# Patient Record
Sex: Female | Born: 1985
Health system: Southern US, Community
[De-identification: ages and names within clinical notes are randomized; demographics above are authoritative.]

---

## 2012-05-01 ENCOUNTER — Emergency Department (HOSPITAL_BASED_OUTPATIENT_CLINIC_OR_DEPARTMENT_OTHER)
Admission: EM | Admit: 2012-05-01 | Discharge: 2012-05-01 | Disposition: A | Discharge status: Home / Self Care | Payer: Self-pay | Attending: Emergency Medicine | Admitting: Emergency Medicine

## 2012-05-01 ENCOUNTER — Encounter (HOSPITAL_BASED_OUTPATIENT_CLINIC_OR_DEPARTMENT_OTHER): Payer: Self-pay | Admitting: *Deleted

## 2012-05-01 DIAGNOSIS — K029 Dental caries, unspecified: Secondary | ICD-10-CM | POA: Insufficient documentation

## 2012-05-01 MED ORDER — HYDROCODONE-ACETAMINOPHEN 5-500 MG PO TABS: 1.0000 | ORAL_TABLET | Freq: Four times a day (QID) | ORAL | Status: AC | PRN

## 2012-05-01 MED ORDER — PENICILLIN V POTASSIUM 500 MG PO TABS: 500.0000 mg | ORAL_TABLET | Freq: Four times a day (QID) | ORAL | Status: AC

## 2012-05-01 MED ORDER — PENICILLIN V POTASSIUM 250 MG PO TABS
500.0000 mg | ORAL_TABLET | Freq: Once | ORAL | Status: AC
  Administered 2012-05-01: 500 mg via ORAL
  Filled 2012-05-01: qty 2

## 2012-05-01 MED ORDER — HYDROCODONE-ACETAMINOPHEN 5-325 MG PO TABS
2.0000 | ORAL_TABLET | Freq: Once | ORAL | Status: AC
  Administered 2012-05-01: 2 via ORAL
  Filled 2012-05-01: qty 2

## 2012-05-01 NOTE — ED Provider Notes (Signed)
History     CSN: [id number]  Arrival date & time 05/01/12  2137   First MD Initiated Contact with Patient 05/01/12 2319      Chief Complaint  Patient presents with  . Dental Pain    (Consider location/radiation/quality/duration/timing/severity/associated sxs/prior treatment) HPI Comments: Patient presents with a two-day history of pain to her left upper and lower back teeth she denies any facial swelling, fevers or difficulty swallowing. She's had some nausea no vomiting. She has not yet seen a dentist for this problem. The pain is beginning worse over the last 2 days she's been taking over-the-counter medicines without relief  Patient is a 26 y.o. female presenting with tooth pain. The history is provided by the patient.  Dental PainThe primary symptoms include mouth pain. Primary symptoms do not include headaches, fever or sore throat.  Additional symptoms do not include: trouble swallowing and fatigue.    History reviewed. No pertinent past medical history.  History reviewed. No pertinent past surgical history.  No family history on file.  History  Substance Use Topics  . Smoking status: Never Smoker   . Smokeless tobacco: Not on file  . Alcohol Use: Yes     rarely    OB History    Grav Para Term Preterm Abortions TAB SAB Ect Mult Living                  Review of Systems  Constitutional: Negative for fever and fatigue.  HENT: Positive for dental problem. Negative for congestion, sore throat, rhinorrhea and trouble swallowing.   Gastrointestinal: Positive for nausea. Negative for vomiting.  Skin: Negative for wound.  Neurological: Negative for dizziness and headaches.    Allergies  Review of patient's allergies indicates no known allergies.  Home Medications   Current Outpatient Rx  Name Route Sig Dispense Refill  . IBUPROFEN 200 MG PO TABS Oral Take 400 mg by mouth every 6 (six) hours as needed. Patient used this medication for her toothache    .  HYDROCODONE-ACETAMINOPHEN 5-500 MG PO TABS Oral Take 1-2 tablets by mouth every 6 (six) hours as needed for pain. 15 tablet 0  . PENICILLIN V POTASSIUM 500 MG PO TABS Oral Take 1 tablet (500 mg total) by mouth 4 (four) times daily. 40 tablet 0    BP 131/85  Pulse 87  Temp 98.2 F (36.8 C) (Oral)  Resp 18  Ht 5\' 2"  (1.575 m)  Wt 175 lb (79.379 kg)  BMI 32.01 kg/m2  SpO2 99%  LMP 04/18/2012  Physical Exam  Constitutional: She is oriented to person, place, and time. She appears well-developed and well-nourished.  HENT:  Head: Normocephalic and atraumatic.       Patient is doing caries throughout with tenderness over her the left upper first molar and the left lower second molar. There is no induration no fluctuance, no trismus, uvula is midline. No elevation of the tongue  Cardiovascular: Normal heart sounds.   Pulmonary/Chest: Effort normal.  Abdominal: Soft. There is no tenderness.  Lymphadenopathy:    She has no cervical adenopathy.  Neurological: She is alert and oriented to person, place, and time.    ED Course  Procedures (including critical care time)  Labs Reviewed - No data to display No results found.   1. Dental caries       MDM  We'll treat with antibiotics, pain medicine and encourage patient to follow up with a dentist as soon as possible  Rolan Bucco, MD 05/01/12 [phone]

## 2012-05-01 NOTE — ED Notes (Signed)
Left sided upper/lower toothache since yesterday, worsened tonight. Poor dentition. Denies fevers.

## 2012-12-07 ENCOUNTER — Encounter (HOSPITAL_BASED_OUTPATIENT_CLINIC_OR_DEPARTMENT_OTHER): Payer: Self-pay

## 2012-12-07 ENCOUNTER — Emergency Department (HOSPITAL_BASED_OUTPATIENT_CLINIC_OR_DEPARTMENT_OTHER)
Admission: EM | Admit: 2012-12-07 | Discharge: 2012-12-07 | Disposition: A | Discharge status: Home / Self Care | Payer: Medicaid Other | Attending: Emergency Medicine | Admitting: Emergency Medicine

## 2012-12-07 DIAGNOSIS — Z79899 Other long term (current) drug therapy: Secondary | ICD-10-CM | POA: Insufficient documentation

## 2012-12-07 DIAGNOSIS — K029 Dental caries, unspecified: Secondary | ICD-10-CM | POA: Insufficient documentation

## 2012-12-07 DIAGNOSIS — K0889 Other specified disorders of teeth and supporting structures: Secondary | ICD-10-CM

## 2012-12-07 MED ORDER — AMOXICILLIN 500 MG PO CAPS: 500.0000 mg | ORAL_CAPSULE | Freq: Three times a day (TID) | ORAL | Status: DC

## 2012-12-07 MED ORDER — IBUPROFEN 800 MG PO TABS
800.0000 mg | ORAL_TABLET | Freq: Once | ORAL | Status: AC
  Administered 2012-12-07: 800 mg via ORAL
  Filled 2012-12-07: qty 1

## 2012-12-07 NOTE — ED Provider Notes (Signed)
History     CSN: [id number]  Arrival date & time 12/07/12  1026   First MD Initiated Contact with Patient 12/07/12 1044      Chief Complaint  Patient presents with  . Dental Pain     Patient is a 27 y.o. female presenting with tooth pain. The history is provided by the patient.  Dental PainThe primary symptoms include mouth pain. Primary symptoms do not include fever. The symptoms began 3 to 5 days ago. The symptoms are worsening. The symptoms are new. The symptoms occur constantly.  Additional symptoms do not include: facial swelling.    History reviewed. No pertinent past medical history.  History reviewed. No pertinent past surgical history.  History reviewed. No pertinent family history.  History  Substance Use Topics  . Smoking status: Passive Smoke Exposure - Never Smoker  . Smokeless tobacco: Never Used  . Alcohol Use: Yes     Comment: rarely    OB History   Grav Para Term Preterm Abortions TAB SAB Ect Mult Living                  Review of Systems  Constitutional: Negative for fever.  HENT: Negative for facial swelling.     Allergies  Review of patient's allergies indicates no known allergies.  Home Medications   Current Outpatient Rx  Name  Route  Sig  Dispense  Refill  . ibuprofen (ADVIL,MOTRIN) 200 MG tablet   Oral   Take 400 mg by mouth every 6 (six) hours as needed. Patient used this medication for her toothache         . amoxicillin (AMOXIL) 500 MG capsule   Oral   Take 1 capsule (500 mg total) by mouth 3 (three) times daily.   21 capsule   0     BP 132/76  Pulse 88  Temp(Src) 98.5 F (36.9 C) (Oral)  Resp 16  Ht 5\' 2"  (1.575 m)  Wt 175 lb (79.379 kg)  BMI 32 kg/m2  SpO2 100%  LMP 12/05/2012  Physical Exam CONSTITUTIONAL: Well developed/well nourished HEAD AND FACE: Normocephalic/atraumatic EYES: EOMI/PERRL ENMT: Mucous membranes moist.  Poor dentition.  No trismus.  No focal abscess noted. NECK: supple no meningeal  signs CV: S1/S2 noted, no murmurs/rubs/gallops noted LUNGS: Lungs are clear to auscultation bilaterally, no apparent distress ABDOMEN: soft, nontender, no rebound or guarding NEURO: Pt is awake/alert, moves all extremitiesx4 EXTREMITIES:full ROM SKIN: warm, color normal  ED Course  Procedures  1. Pain, dental       MDM  Nursing notes including past medical history and social history reviewed and considered in documentation         Joya Gaskins, MD 12/07/12 1109

## 2012-12-07 NOTE — ED Notes (Signed)
Pt states that she has toothache in the R upper posterior jaw.  Pt states that she does not have health insurance until march and is trying to hold out on going to the dentist until then because she cannot afford it.  Pt states that she has been taking motrin for the pain with no relief.  Pt denies fever, n/v.

## 2017-11-22 ENCOUNTER — Other Ambulatory Visit: Payer: Self-pay

## 2017-11-22 ENCOUNTER — Emergency Department (HOSPITAL_BASED_OUTPATIENT_CLINIC_OR_DEPARTMENT_OTHER): Payer: PRIVATE HEALTH INSURANCE

## 2017-11-22 ENCOUNTER — Encounter (HOSPITAL_BASED_OUTPATIENT_CLINIC_OR_DEPARTMENT_OTHER): Payer: Self-pay | Admitting: *Deleted

## 2017-11-22 ENCOUNTER — Emergency Department (HOSPITAL_BASED_OUTPATIENT_CLINIC_OR_DEPARTMENT_OTHER)
Admission: EM | Admit: 2017-11-22 | Discharge: 2017-11-22 | Disposition: A | Discharge status: Home / Self Care | Payer: PRIVATE HEALTH INSURANCE | Attending: Emergency Medicine | Admitting: Emergency Medicine

## 2017-11-22 DIAGNOSIS — Z7722 Contact with and (suspected) exposure to environmental tobacco smoke (acute) (chronic): Secondary | ICD-10-CM | POA: Diagnosis not present

## 2017-11-22 DIAGNOSIS — J189 Pneumonia, unspecified organism: Secondary | ICD-10-CM

## 2017-11-22 DIAGNOSIS — R509 Fever, unspecified: Secondary | ICD-10-CM | POA: Diagnosis present

## 2017-11-22 DIAGNOSIS — J181 Lobar pneumonia, unspecified organism: Secondary | ICD-10-CM | POA: Insufficient documentation

## 2017-11-22 LAB — BASIC METABOLIC PANEL
Anion gap: 10 (ref 5–15)
BUN: <5 mg/dL — ABNORMAL LOW (ref 6–20)
CO2: 21 mmol/L — ABNORMAL LOW (ref 22–32)
Calcium: 9.2 mg/dL (ref 8.9–10.3)
Chloride: 104 mmol/L (ref 101–111)
Creatinine, Ser: 0.57 mg/dL (ref 0.44–1.00)
GFR calc Af Amer: >60 mL/min (ref >60)
GFR calc non Af Amer: >60 mL/min (ref >60)
Glucose, Bld: 118 mg/dL — ABNORMAL HIGH (ref 65–99)
Potassium: 3.4 mmol/L — ABNORMAL LOW (ref 3.5–5.1)
Sodium: 135 mmol/L (ref 135–145)

## 2017-11-22 LAB — CBC WITH DIFFERENTIAL/PLATELET
Basophils Absolute: 0 10*3/uL (ref 0.0–0.1)
Basophils Relative: 0 %
Eosinophils Absolute: 0.1 10*3/uL (ref 0.0–0.7)
Eosinophils Relative: 1 %
HCT: 39.3 % (ref 36.0–46.0)
Hemoglobin: 13 g/dL (ref 12.0–15.0)
Lymphocytes Relative: 17 %
Lymphs Abs: 1.7 10*3/uL (ref 0.7–4.0)
MCH: 29 pg (ref 26.0–34.0)
MCHC: 33.1 g/dL (ref 30.0–36.0)
MCV: 87.7 fL (ref 78.0–100.0)
Monocytes Absolute: 0.8 10*3/uL (ref 0.1–1.0)
Monocytes Relative: 8 %
Neutro Abs: 7.7 10*3/uL (ref 1.7–7.7)
Neutrophils Relative %: 74 %
Platelets: 210 10*3/uL (ref 150–400)
RBC: 4.48 MIL/uL (ref 3.87–5.11)
RDW: 12.6 % (ref 11.5–15.5)
WBC: 10.4 10*3/uL (ref 4.0–10.5)

## 2017-11-22 MED ORDER — SODIUM CHLORIDE 0.9 % IV BOLUS (SEPSIS)
1000.0000 mL | Freq: Once | INTRAVENOUS | Status: AC
  Administered 2017-11-22: 1000 mL via INTRAVENOUS

## 2017-11-22 MED ORDER — AZITHROMYCIN 250 MG PO TABS: 250.0000 mg | ORAL_TABLET | Freq: Every day | ORAL | 0 refills | Status: AC

## 2017-11-22 MED ORDER — ACETAMINOPHEN 500 MG PO TABS
1000.0000 mg | ORAL_TABLET | Freq: Once | ORAL | Status: AC
  Administered 2017-11-22: 1000 mg via ORAL
  Filled 2017-11-22: qty 2

## 2017-11-22 MED FILL — AZITHROMYCIN 250 MG TABLET: 250 | 5 days supply | Qty: 6 | Fill #0

## 2017-11-22 NOTE — ED Triage Notes (Signed)
Pt c/o URi symptoms x 6 days  

## 2017-11-22 NOTE — Discharge Instructions (Signed)
Please take all of your antibiotics until finished!   You may develop abdominal discomfort or diarrhea from the antibiotic.  You may help offset this with probiotics which you can buy or get in yogurt. Do not eat  or take the probiotics until 2 hours after your antibiotic.   Alternate 600 mg of ibuprofen and [phone] mg of Tylenol every 3 hours as needed for pain/fever for 3 days. Do not exceed 4000 mg of Tylenol daily.  Drink plenty of fluids and get plenty of rest.  Follow-up with a primary care physician for re-eval return to the emergency department if any concerning signs or symptoms develop such as fever not controlled by ibuprofen or Tylenol, coughing up blood, severe chest pain or difficulty breathing.

## 2017-11-22 NOTE — ED Provider Notes (Signed)
[facility] EMERGENCY DEPARTMENT Provider Note   CSN: [id number] Arrival date & time: 11/22/17  1152     History   Chief Complaint Chief Complaint  Patient presents with  . URI    HPI Crystal Stevens is a 32 y.o. female presents today with chief complaint gradual onset, progressively worsening fevers, myalgias, and productive cough.  She states that symptoms began on Thursday with generalized myalgias.  Cough is productive of yellow sputum.  She is a non-smoker.  She endorses shortness of breath and dyspnea on exertion.  She denies chest pain.  Fevers at home, T-max 45 F.  She has been taking ibuprofen and Tylenol as well as Mucinex.  She also endorses mild sore throat.  Denies nasal congestion.  Has had some nausea but no vomiting or abdominal pain or diarrhea.  She states her son tested positive for the flu last week and she felt similar symptoms shortly thereafter.  No recent travel or surgeries, no hemoptysis, no prior history of DVT or PE.  She does use Mirena for birth control.    The history is provided by the patient.    History reviewed. No pertinent past medical history.  There are no active problems to display for this patient.   History reviewed. No pertinent surgical history.  OB History    No data available       Home Medications    Prior to Admission medications   Medication Sig Start Date End Date Taking? Authorizing Provider  ibuprofen (ADVIL,MOTRIN) 200 MG tablet Take 400 mg by mouth every 6 (six) hours as needed. Patient used this medication for her toothache   Yes [provider]  azithromycin (ZITHROMAX) 250 MG tablet Take 1 tablet (250 mg total) by mouth daily. Take first 2 tablets together, then 1 every day until finished. 11/22/17   Jeanie Sewer, PA-C    Family History History reviewed. No pertinent family history.  Social History Social History   Tobacco Use  . Smoking status: Passive Smoke Exposure - Never Smoker  .  Smokeless tobacco: Never Used  Substance Use Topics  . Alcohol use: Yes    Comment: rarely  . Drug use: No     Allergies   Patient has no known allergies.   Review of Systems Review of Systems  Constitutional: Positive for chills and fever.  HENT: Positive for sore throat.   Respiratory: Positive for cough and shortness of breath.   Cardiovascular: Negative for chest pain.  Gastrointestinal: Positive for nausea. Negative for abdominal pain and vomiting.  Neurological: Negative for syncope.  All other systems reviewed and are negative.    Physical Exam Updated Vital Signs BP 124/75 (BP Location: Right Arm)   Pulse 97   Temp 99.3 F (37.4 C) (Oral)   Resp 18   Ht 5\' 3"  (1.6 m)   Wt 83.9 kg (185 lb)   LMP 10/22/2017   SpO2 95%   BMI 32.77 kg/m    Physical Exam  Constitutional: She appears well-developed and well-nourished. No distress.  HENT:  Head: Normocephalic and atraumatic.  Right Ear: External ear normal.  Left Ear: External ear normal.  Mouth/Throat: Oropharynx is clear and moist.  No frontal or maxillary sinus tenderness.  TMs without erythema or bulging.  Nasal septum midline, no mucosal edema.  Posterior oropharynx with mild erythema but no tonsillar hypertrophy, exudates, or uvular deviation.  No trismus.  Eyes: Conjunctivae and EOM are normal. Pupils are equal, round, and reactive to light.  Right eye exhibits no discharge. Left eye exhibits no discharge.  Neck: Normal range of motion. Neck supple. No JVD present. No tracheal deviation present.  Cardiovascular: Regular rhythm, normal heart sounds and intact distal pulses.  Tachycardic, 2+ radial and DP/PT pulses bl, negative Homan's bl, no calf tenderness, no lower extremity edema  Pulmonary/Chest: Effort normal. She has rales. She exhibits no tenderness.  Mild scattered rales to right lower lung field, equal rise and fall of chest, no increased work of breathing, speaking in full sentences without  difficulty SPO2 saturations 94% on room air  Abdominal: Soft. Bowel sounds are normal. There is no tenderness.  Musculoskeletal: Normal range of motion. She exhibits no edema or tenderness.  Neurological: She is alert.  Skin: Skin is warm and dry. No erythema.  Psychiatric: She has a normal mood and affect. Her behavior is normal.  Nursing note and vitals reviewed.    ED Treatments / Results  Labs (all labs ordered are listed, but only abnormal results are displayed) Labs Reviewed  BASIC METABOLIC PANEL - Abnormal; Notable for the following components:      Result Value   Potassium 3.4 (*)    CO2 21 (*)    Glucose, Bld 118 (*)    BUN <5 (*)    All other components within normal limits  CBC WITH DIFFERENTIAL/PLATELET    EKG  EKG Interpretation None       Radiology Dg Chest 2 View  Result Date: 11/22/2017 CLINICAL DATA:  Fever, cough, body aches, ha, nausea x 2 days. EXAM: CHEST  2 VIEW COMPARISON:  None FINDINGS: Heart size is normal. There is patchy infiltrate in the medial right lung base consistent with infectious process. Left lung is clear. No pulmonary edema. IMPRESSION: Right lower lobe infiltrate. Electronically Signed   By: Norva Pavlov M.D.   On: 11/22/2017 12:44    Procedures Procedures (including critical care time)  Medications Ordered in ED Medications  sodium chloride 0.9 % bolus 1,000 mL (0 mLs Intravenous Stopped 11/22/17 1611)  acetaminophen (TYLENOL) tablet 1,000 mg (1,000 mg Oral Given 11/22/17 1509)     Initial Impression / Assessment and Plan / ED Course  I have reviewed the triage vital signs and the nursing notes.  Pertinent labs & imaging results that were available during my care of the patient were reviewed by me and considered in my medical decision making (see chart for details).    Patient presents with fevers and productive cough for several days as well as generalized myalgias.  Low-grade fever of 100.7 F on my assessment with  improvement after administration of Tylenol.  She is nontoxic in appearance.  No meningeal signs to suggest meningitis.  Posterior oropharynx is  unconcerning for strep pharyngitis or PTA.  Lab work is reassuring.  Chest x-ray shows right lower lobe infiltrate consistent with pneumonia.  Doubt ACS or MI in the absence of chest pain.  Doubt PE. On reevaluation, patient is resting comfortably in no apparent distress.  She is ambulatory without difficulty and exhibiting no increased work of breathing.  SPO2 saturations remained stable.  Curb 65 score of 0, patient stable for outpatient treatment with azithromycin.  She will follow-up with a primary care physician for reevaluation and repeat chest x-ray.  Discussed indications for return to the ED. Pt verbalized understanding of and agreement with plan and is safe for discharge home at this time.  She has no complaints prior to discharge.  Final Clinical Impressions(s) / ED Diagnoses  Final diagnoses:  Community acquired pneumonia of right lower lobe of lung [facility](HCC)    ED Discharge Orders        Ordered    azithromycin (ZITHROMAX) 250 MG tablet  Daily     11/22/17 1603      Jeanie SewerFawze, Wyonia Fontanella A, PA-C 11/22/17 1638  Alvira MondaySchlossman, Erin, MD 11/22/17 2106

## 2019-08-08 IMAGING — CR DG CHEST 2V
2 series · 2 of 2 positions shown · non-contrast
Comparison: None

CLINICAL DATA: Fever, cough, body aches, ha, nausea x 2 days.

EXAM:
CHEST  2 VIEW

[w chest pa]
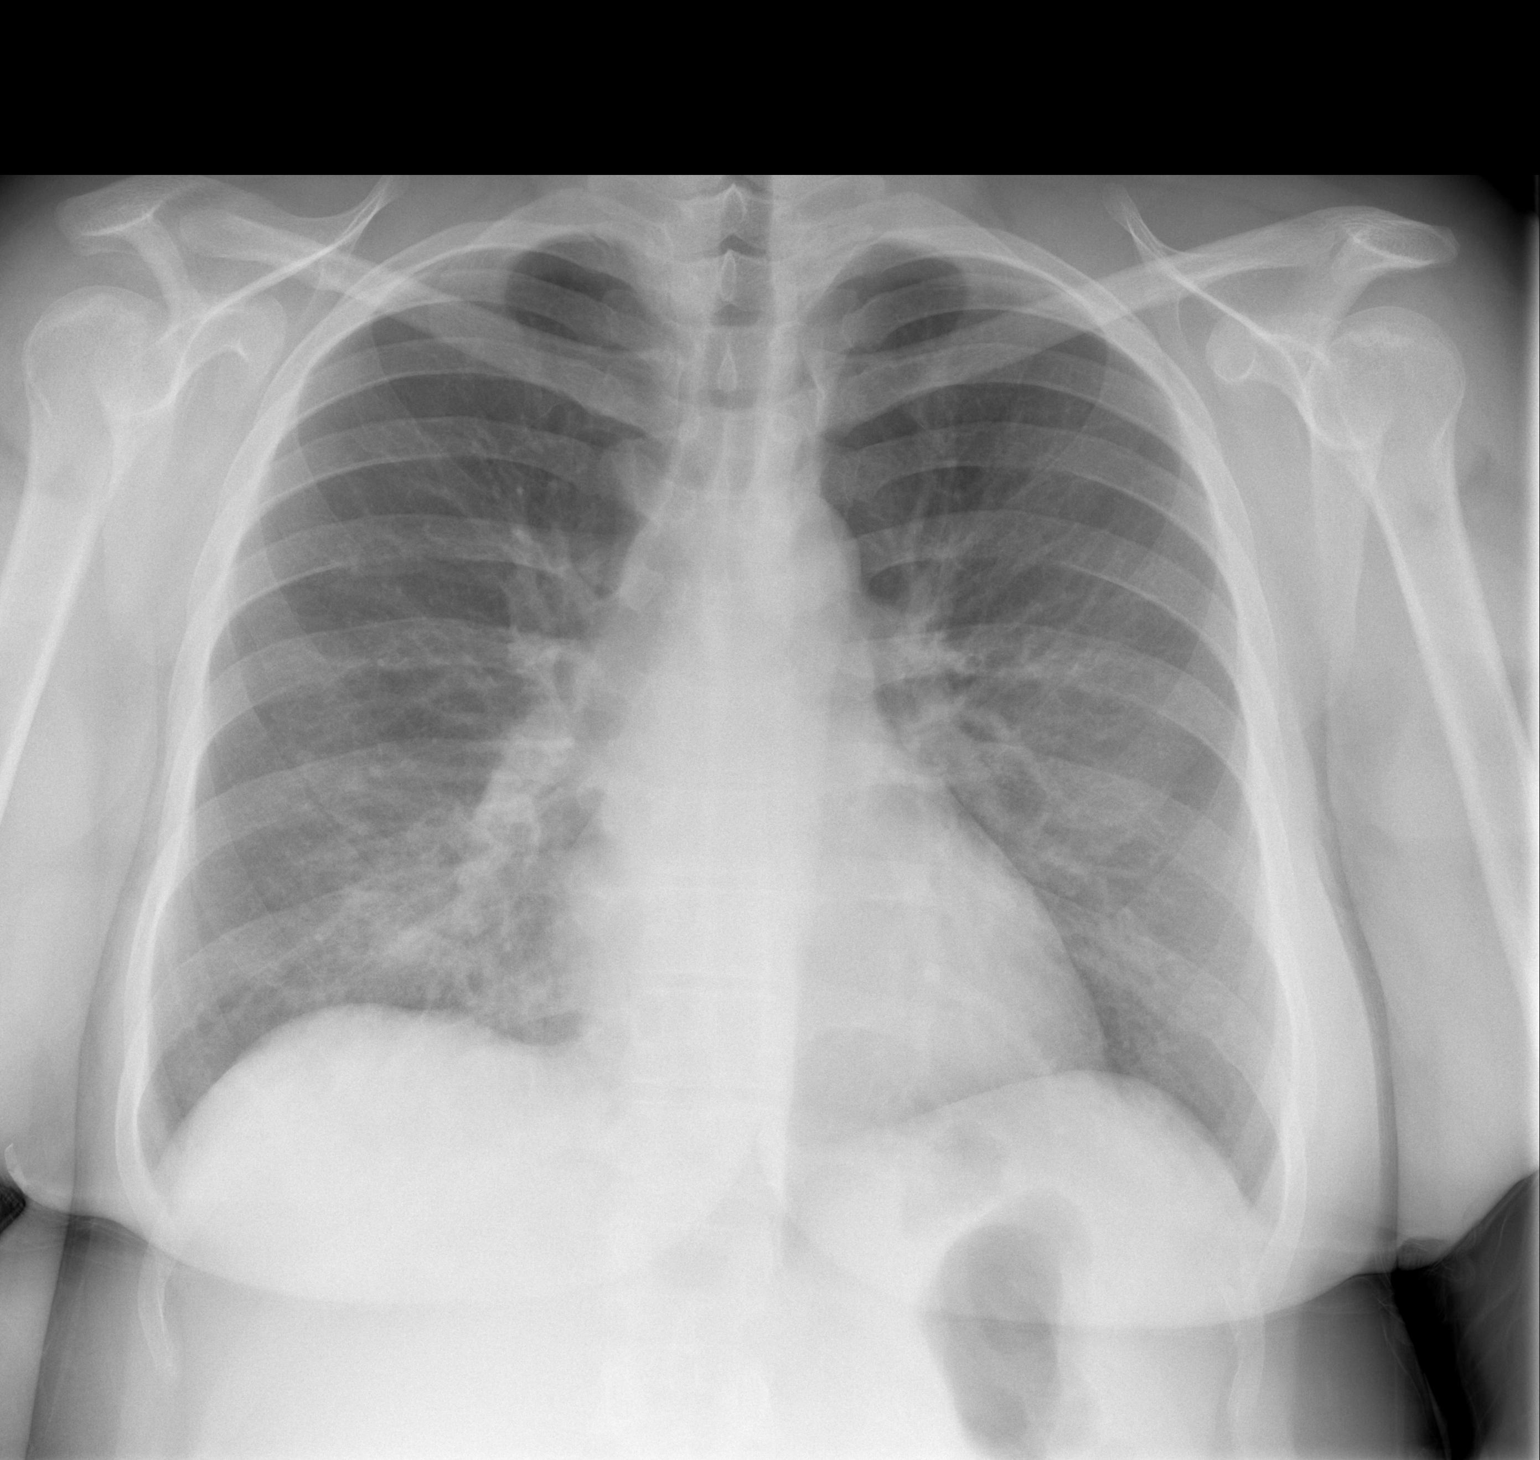

[w chest lat]
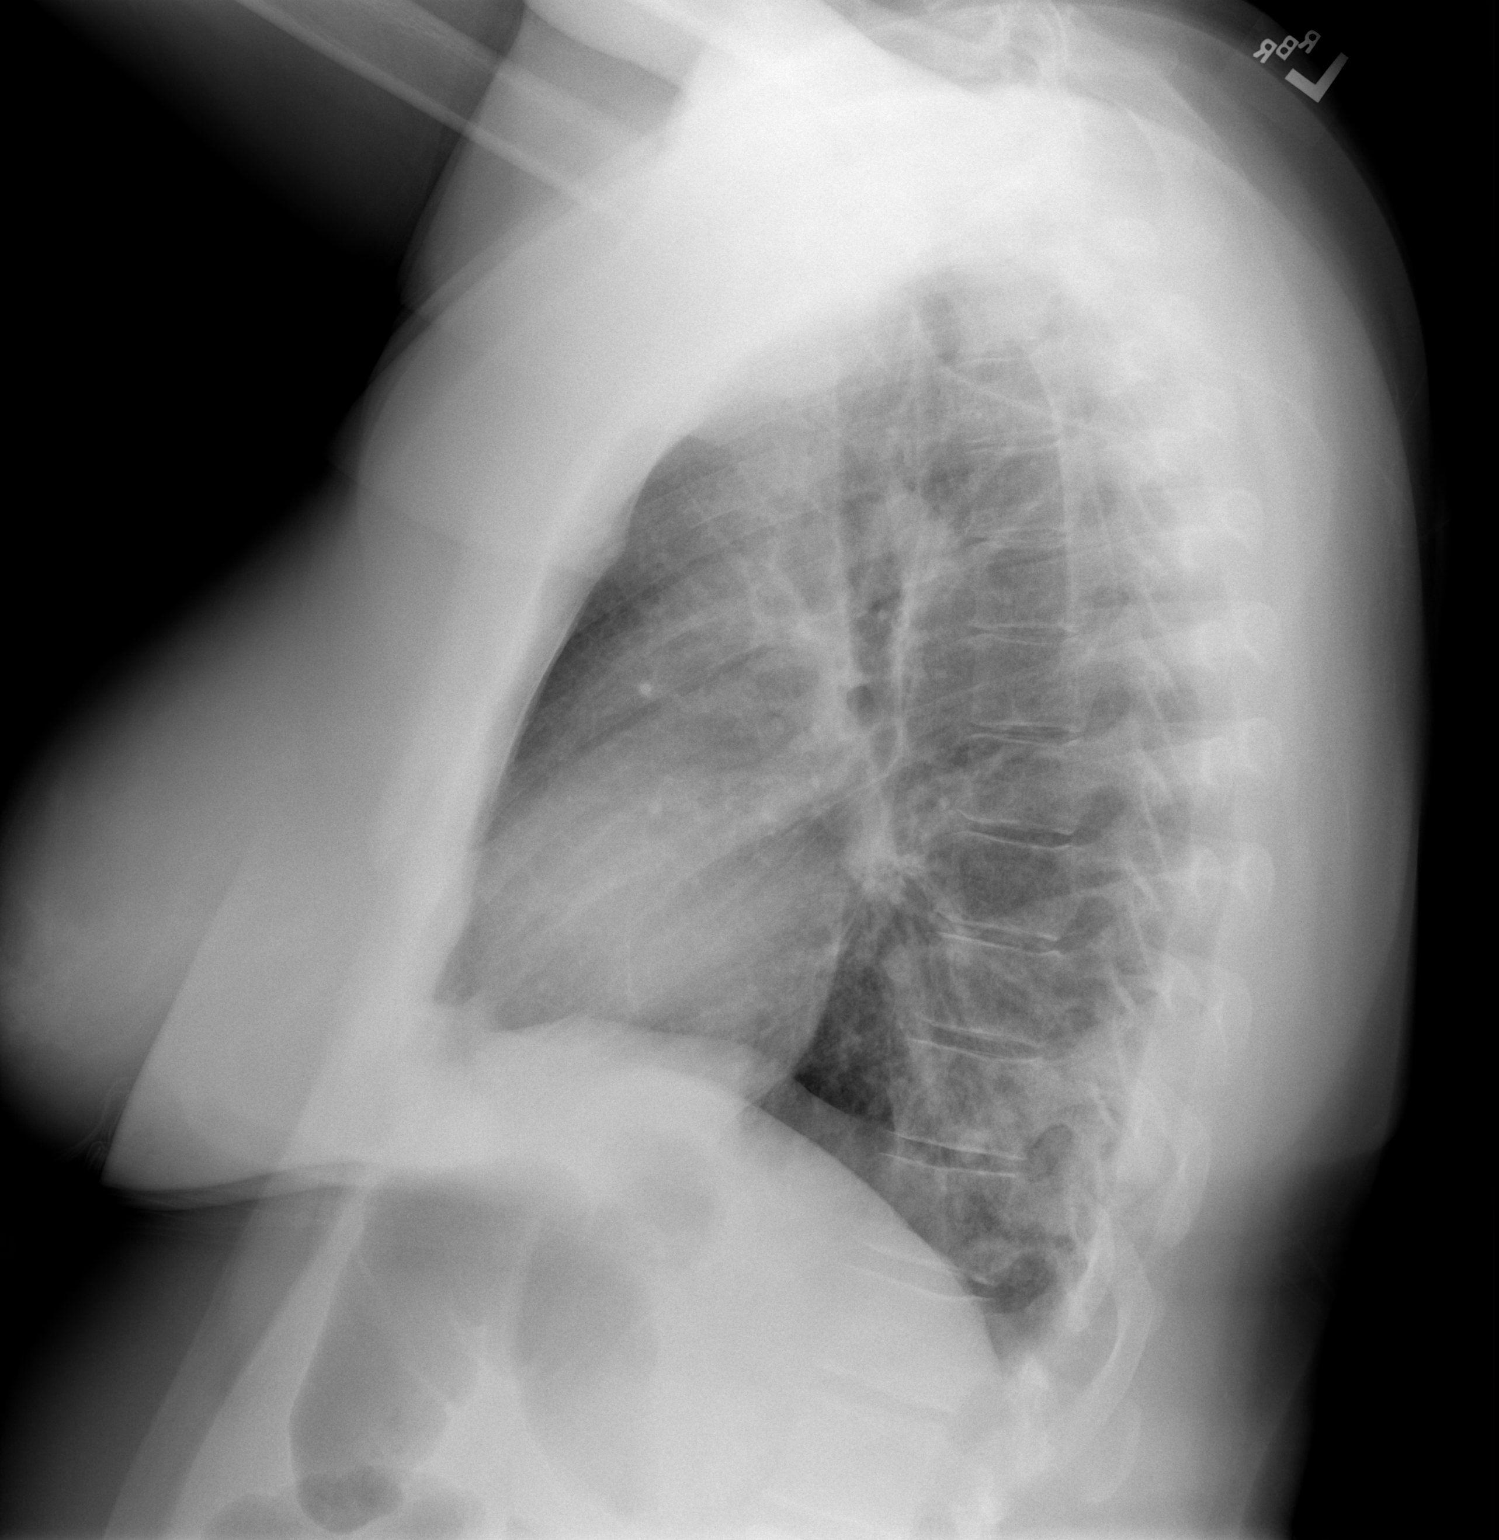

[2 of 2 positions shown; findings below may reference images not displayed]

FINDINGS: Heart size is normal. There is patchy infiltrate in the medial right
lung base consistent with infectious process. Left lung is clear. No
pulmonary edema.
IMPRESSION: Right lower lobe infiltrate.
